# Patient Record
Sex: Female | Born: 2000 | Race: White | Hispanic: No | Marital: Single | Smoking: Never smoker
Health system: Southern US, Community
[De-identification: ages and names within clinical notes are randomized; demographics above are authoritative.]

## PROBLEM LIST (undated history)

## (undated) DIAGNOSIS — F840 Autistic disorder: Secondary | ICD-10-CM

## (undated) DIAGNOSIS — D649 Anemia, unspecified: Secondary | ICD-10-CM

## (undated) HISTORY — PX: WISDOM TOOTH EXTRACTION: SHX21

## (undated) HISTORY — DX: Anemia, unspecified: D64.9

## (undated) HISTORY — DX: Autistic disorder: F84.0

---
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
## 2018-08-05 ENCOUNTER — Ambulatory Visit (HOSPITAL_COMMUNITY)
Admission: RE | Admit: 2018-08-05 | Discharge: 2018-08-05 | Disposition: A | Discharge status: Home / Self Care | Payer: Managed Care, Other (non HMO) | Attending: Psychiatry | Admitting: Psychiatry

## 2018-08-05 DIAGNOSIS — F329 Major depressive disorder, single episode, unspecified: Secondary | ICD-10-CM | POA: Insufficient documentation

## 2018-08-05 NOTE — H&P (Signed)
Behavioral Health Medical Screening Exam  Cathy Williamson is an 17 y.o. female presenting with request to see a therapist due to mood volatility with periods of depression, increased energy, impulsivity, anger, irritability and sadness.She endorses alternating sleep patterns, but denies SI/SA or HI. She also denies AVH, paranoia or delusional thoughts.  Total Time spent with patient: 15 minutes  Psychiatric Specialty Exam: Physical Exam  Constitutional: She appears well-developed and well-nourished. No distress.  HENT:  Head: Normocephalic.  Eyes: Pupils are equal, round, and reactive to light.  Respiratory: Effort normal and breath sounds normal. No respiratory distress.  Skin: Skin is warm and dry. She is not diaphoretic.  Psychiatric: Her mood appears anxious. Her speech is rapid and/or pressured. Cognition and memory are impaired. She expresses impulsivity and inappropriate judgment. She exhibits a depressed mood. She expresses no homicidal and no suicidal ideation. She expresses no homicidal plans. She is inattentive.    Review of Systems  Constitutional: Negative.  Negative for chills, diaphoresis, fever, malaise/fatigue and weight loss.  Psychiatric/Behavioral: Positive for depression and substance abuse. Negative for suicidal ideas. The patient is nervous/anxious and has insomnia.     There were no vitals taken for this visit.There is no height or weight on file to calculate BMI.  General Appearance: Casual  Eye Contact:  Fair  Speech:  Clear and Coherent  Volume:  Normal  Mood:  Anxious and Depressed  Affect:  Full Range  Thought Process:  Disorganized  Orientation:  Full (Time, Place, and Person)  Thought Content:  Logical  Suicidal Thoughts:  No  Homicidal Thoughts:  No  Memory:  Immediate;   Fair  Judgement:  Fair  Insight:  Fair  Psychomotor Activity:  Normal  Concentration: Concentration: Fair  Recall:  [employer] of Knowledge:Fair  Language: Fair  Akathisia:   Negative  Handed:  Right  AIMS (if indicated):     Assets:  Desire for Improvement  Sleep:       Musculoskeletal: Strength & Muscle Tone: within normal limits Gait & Station: normal Patient leans: N/A  There were no vitals taken for this visit.  Recommendations:  Based on my evaluation the patient does not appear to have an emergency medical condition.  Kerry Hough, PA-C 08/05/2018, 10:55 PM

## 2019-08-24 DIAGNOSIS — F603 Borderline personality disorder: Secondary | ICD-10-CM | POA: Insufficient documentation

## 2020-07-19 HISTORY — PX: BREAST ENHANCEMENT SURGERY: SHX7

## 2023-05-28 DIAGNOSIS — R21 Rash and other nonspecific skin eruption: Secondary | ICD-10-CM | POA: Diagnosis not present

## 2023-06-04 DIAGNOSIS — F419 Anxiety disorder, unspecified: Secondary | ICD-10-CM | POA: Diagnosis not present

## 2023-06-04 DIAGNOSIS — R195 Other fecal abnormalities: Secondary | ICD-10-CM | POA: Diagnosis not present

## 2023-06-04 DIAGNOSIS — R10819 Abdominal tenderness, unspecified site: Secondary | ICD-10-CM | POA: Diagnosis not present

## 2023-06-09 DIAGNOSIS — R10819 Abdominal tenderness, unspecified site: Secondary | ICD-10-CM | POA: Diagnosis not present

## 2023-06-09 DIAGNOSIS — R194 Change in bowel habit: Secondary | ICD-10-CM | POA: Diagnosis not present

## 2023-06-23 DIAGNOSIS — G478 Other sleep disorders: Secondary | ICD-10-CM | POA: Diagnosis not present

## 2023-06-23 DIAGNOSIS — Z209 Contact with and (suspected) exposure to unspecified communicable disease: Secondary | ICD-10-CM | POA: Diagnosis not present

## 2023-06-23 DIAGNOSIS — R194 Change in bowel habit: Secondary | ICD-10-CM | POA: Diagnosis not present

## 2023-06-25 DIAGNOSIS — J309 Allergic rhinitis, unspecified: Secondary | ICD-10-CM | POA: Diagnosis not present

## 2023-07-17 ENCOUNTER — Encounter: Payer: Self-pay | Admitting: Obstetrics and Gynecology

## 2023-07-21 ENCOUNTER — Ambulatory Visit: Admitting: Medical

## 2023-07-21 ENCOUNTER — Other Ambulatory Visit: Payer: Self-pay

## 2023-07-21 ENCOUNTER — Encounter: Payer: Self-pay | Admitting: Medical

## 2023-07-21 VITALS — Wt 132.1 lb

## 2023-07-21 DIAGNOSIS — Z3202 Encounter for pregnancy test, result negative: Secondary | ICD-10-CM

## 2023-07-21 DIAGNOSIS — Z3046 Encounter for surveillance of implantable subdermal contraceptive: Secondary | ICD-10-CM | POA: Diagnosis not present

## 2023-07-21 DIAGNOSIS — Z30017 Encounter for initial prescription of implantable subdermal contraceptive: Secondary | ICD-10-CM

## 2023-07-21 DIAGNOSIS — Z124 Encounter for screening for malignant neoplasm of cervix: Secondary | ICD-10-CM

## 2023-07-21 MED ORDER — ETONOGESTREL 68 MG ~~LOC~~ IMPL
68.0000 mg | DRUG_IMPLANT | Freq: Once | SUBCUTANEOUS | Status: AC
  Administered 2023-07-21: 68 mg via SUBCUTANEOUS
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
## 2023-07-22 LAB — POCT PREGNANCY, URINE: Preg Test, Ur: NEGATIVE

## 2023-07-30 DIAGNOSIS — G541 Lumbosacral plexus disorders: Secondary | ICD-10-CM | POA: Diagnosis not present

## 2023-07-30 DIAGNOSIS — R194 Change in bowel habit: Secondary | ICD-10-CM | POA: Diagnosis not present

## 2023-07-30 DIAGNOSIS — F419 Anxiety disorder, unspecified: Secondary | ICD-10-CM | POA: Diagnosis not present

## 2023-08-18 DIAGNOSIS — R10816 Epigastric abdominal tenderness: Secondary | ICD-10-CM | POA: Diagnosis not present

## 2023-08-18 DIAGNOSIS — R195 Other fecal abnormalities: Secondary | ICD-10-CM | POA: Diagnosis not present

## 2023-08-18 DIAGNOSIS — R1030 Lower abdominal pain, unspecified: Secondary | ICD-10-CM | POA: Diagnosis not present

## 2023-08-28 DIAGNOSIS — R1010 Upper abdominal pain, unspecified: Secondary | ICD-10-CM | POA: Diagnosis not present

## 2023-08-28 DIAGNOSIS — R1030 Lower abdominal pain, unspecified: Secondary | ICD-10-CM | POA: Diagnosis not present

## 2023-10-21 DIAGNOSIS — R112 Nausea with vomiting, unspecified: Secondary | ICD-10-CM | POA: Diagnosis not present

## 2023-10-21 DIAGNOSIS — R1030 Lower abdominal pain, unspecified: Secondary | ICD-10-CM | POA: Diagnosis not present

## 2023-10-21 DIAGNOSIS — R10816 Epigastric abdominal tenderness: Secondary | ICD-10-CM | POA: Diagnosis not present

## 2023-10-30 ENCOUNTER — Telehealth: Payer: Self-pay | Admitting: Family Medicine
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
## 2023-10-31 NOTE — Telephone Encounter (Signed)
 RN contacted pt in regards to concerns of pain with sex, bleeding after intercourse, and yeast infection symptoms.  Pt was walking into class and was not able to speak long.  RN recommended schedule provider appointment for intercourse concerns but offered to schedule nurse visit to self swab to test for yeast.  Pt agreeable to scheduling nurse visit and mentioned she needed afternoon appointments due to school.  Message sent to pt with appointment options.    Waddell, RN

## 2023-11-11 ENCOUNTER — Ambulatory Visit

## 2023-11-11 NOTE — Progress Notes (Deleted)
 Zamari Bonsall is here with concern of ***. These symptoms have been present for ***. Patient reports she has {trialed:28378}  Pertinent history:  Plan of care:   Self swab instructions given and specimen obtained. Explained patient will be contacted with any abnormal results. Patient is {annual ilz:71619}  Cyndee JAYSON Molt, RN 11/11/2023  8:33 AM

## 2023-11-24 DIAGNOSIS — D234 Other benign neoplasm of skin of scalp and neck: Secondary | ICD-10-CM | POA: Diagnosis not present

## 2023-11-24 DIAGNOSIS — D224 Melanocytic nevi of scalp and neck: Secondary | ICD-10-CM | POA: Diagnosis not present

## 2023-11-24 DIAGNOSIS — D229 Melanocytic nevi, unspecified: Secondary | ICD-10-CM | POA: Diagnosis not present

## 2023-11-24 DIAGNOSIS — D485 Neoplasm of uncertain behavior of skin: Secondary | ICD-10-CM | POA: Diagnosis not present

## 2023-11-25 DIAGNOSIS — R1030 Lower abdominal pain, unspecified: Secondary | ICD-10-CM | POA: Diagnosis not present

## 2024-01-09 ENCOUNTER — Telehealth: Payer: Self-pay

## 2024-01-09 NOTE — Telephone Encounter (Signed)
 Pt left voicemail stating that she has appointment on Monday for what she assumed was for a Pap Smear under sedation, she reports she was told she could be sedated due to her history and would like to know this process and if insurance would cover it.
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
## 2024-01-12 ENCOUNTER — Other Ambulatory Visit: Payer: Self-pay

## 2024-01-12 ENCOUNTER — Ambulatory Visit: Admitting: Family Medicine

## 2024-01-12 ENCOUNTER — Encounter: Payer: Self-pay | Admitting: Family Medicine

## 2024-01-12 VITALS — BP 109/73 | HR 77 | Ht 65.0 in | Wt 147.1 lb

## 2024-01-12 DIAGNOSIS — F32A Depression, unspecified: Secondary | ICD-10-CM

## 2024-01-12 DIAGNOSIS — F40248 Other situational type phobia: Secondary | ICD-10-CM

## 2024-01-12 DIAGNOSIS — F419 Anxiety disorder, unspecified: Secondary | ICD-10-CM | POA: Diagnosis not present

## 2024-01-12 MED ORDER — LORAZEPAM 0.5 MG PO TABS
0.5000 mg | ORAL_TABLET | Freq: Three times a day (TID) | ORAL | 0 refills | Status: AC
Start: 2024-01-12 — End: ?
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
## 2024-01-12 NOTE — Patient Instructions (Signed)
 Next appointment - Bring Ativan to the office - Come with a person to drive you afterwards - We will plan to have you undress, wait down only without exam.

## 2024-01-30 ENCOUNTER — Encounter: Payer: Self-pay | Admitting: Family Medicine

## 2024-04-09 ENCOUNTER — Telehealth: Payer: Self-pay | Admitting: *Deleted

## 2024-04-09 DIAGNOSIS — B3731 Acute candidiasis of vulva and vagina: Secondary | ICD-10-CM

## 2024-04-09 MED ORDER — FLUCONAZOLE 150 MG PO TABS: 150.0000 mg | ORAL_TABLET | Freq: Once | ORAL | 0 refills | Status: AC
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
